# Patient Record
Sex: Female | Born: 1945 | Race: Black or African American | Hispanic: No | Marital: Married | Smoking: Never smoker
Health system: Southern US, Community
[De-identification: ages and names within clinical notes are randomized; demographics above are authoritative.]

## PROBLEM LIST (undated history)

## (undated) DIAGNOSIS — I10 Essential (primary) hypertension: Secondary | ICD-10-CM

## (undated) DIAGNOSIS — E119 Type 2 diabetes mellitus without complications: Secondary | ICD-10-CM

## (undated) DIAGNOSIS — K219 Gastro-esophageal reflux disease without esophagitis: Secondary | ICD-10-CM

## (undated) HISTORY — PX: ABDOMINAL HYSTERECTOMY: SHX81

## (undated) HISTORY — PX: CHOLECYSTECTOMY: SHX55

---

## 2018-04-01 ENCOUNTER — Encounter (HOSPITAL_BASED_OUTPATIENT_CLINIC_OR_DEPARTMENT_OTHER): Payer: Self-pay | Admitting: *Deleted

## 2018-04-01 ENCOUNTER — Other Ambulatory Visit: Payer: Self-pay

## 2018-04-01 ENCOUNTER — Emergency Department (HOSPITAL_BASED_OUTPATIENT_CLINIC_OR_DEPARTMENT_OTHER)
Admission: EM | Admit: 2018-04-01 | Discharge: 2018-04-01 | Disposition: A | Discharge status: Home / Self Care | Payer: Medicare Other | Attending: Emergency Medicine | Admitting: Emergency Medicine

## 2018-04-01 ENCOUNTER — Emergency Department (HOSPITAL_BASED_OUTPATIENT_CLINIC_OR_DEPARTMENT_OTHER): Payer: Medicare Other

## 2018-04-01 DIAGNOSIS — Y998 Other external cause status: Secondary | ICD-10-CM | POA: Insufficient documentation

## 2018-04-01 DIAGNOSIS — Y33XXXA Other specified events, undetermined intent, initial encounter: Secondary | ICD-10-CM | POA: Insufficient documentation

## 2018-04-01 DIAGNOSIS — Y939 Activity, unspecified: Secondary | ICD-10-CM | POA: Diagnosis not present

## 2018-04-01 DIAGNOSIS — S46911A Strain of unspecified muscle, fascia and tendon at shoulder and upper arm level, right arm, initial encounter: Secondary | ICD-10-CM | POA: Diagnosis not present

## 2018-04-01 DIAGNOSIS — I10 Essential (primary) hypertension: Secondary | ICD-10-CM | POA: Insufficient documentation

## 2018-04-01 DIAGNOSIS — E119 Type 2 diabetes mellitus without complications: Secondary | ICD-10-CM | POA: Insufficient documentation

## 2018-04-01 DIAGNOSIS — S4991XA Unspecified injury of right shoulder and upper arm, initial encounter: Secondary | ICD-10-CM | POA: Diagnosis present

## 2018-04-01 DIAGNOSIS — Y929 Unspecified place or not applicable: Secondary | ICD-10-CM | POA: Insufficient documentation

## 2018-04-01 DIAGNOSIS — Z79899 Other long term (current) drug therapy: Secondary | ICD-10-CM | POA: Insufficient documentation

## 2018-04-01 HISTORY — DX: Type 2 diabetes mellitus without complications: E11.9

## 2018-04-01 HISTORY — DX: Gastro-esophageal reflux disease without esophagitis: K21.9

## 2018-04-01 HISTORY — DX: Essential (primary) hypertension: I10

## 2018-04-01 MED ORDER — DICLOFENAC SODIUM 1 % TD GEL: 2.0000 g | Freq: Four times a day (QID) | TRANSDERMAL | 0 refills | Status: AC

## 2018-04-01 NOTE — ED Triage Notes (Signed)
Pain in her right upper arm x 3 months. The pain goes up her neck and into her right ear. states she also has ringing in her ear.

## 2018-04-01 NOTE — ED Provider Notes (Signed)
[facility] EMERGENCY DEPARTMENT Provider Note   CSN: [id number] Arrival date & time: 04/01/18  1513     History   Chief Complaint Chief Complaint  Patient presents with  . Arm Pain    HPI Annette Bond is a 72 y.o. female.  The history is provided by the patient. No language interpreter was used.  Arm Pain    Annette Bond is a 72 y.o. female who presents to the Emergency Department complaining of arm pain. Resents to the emergency department complaining of pain to the right mid humerus for the last three weeks. It is described as throbbing in nature and at times radiates to her right scapular region. It is worse with twisting her body to the right as well as sleeping on her left side. No change with activity. She denies any fevers, chest pain, shortness of breath, change in activity tolerance, nausea, vomiting, abdominal pain, leg swelling or pain. She is taken Tylenol and ibuprofen at home with improvement in her symptoms. She denies any neck pain or back pain. She has a history of hypertension and prediabetes. She recently had a routine physical. No history of blood clots. No history of heart disease. Past Medical History:  Diagnosis Date  . Diabetes mellitus without complication (HCC)   . GERD (gastroesophageal reflux disease)   . Hypertension     There are no active problems to display for this patient.   Past Surgical History:  Procedure Laterality Date  . ABDOMINAL HYSTERECTOMY    . CHOLECYSTECTOMY       OB History   None      Home Medications    Prior to Admission medications   Medication Sig Start Date End Date Taking? Authorizing Provider  Esomeprazole Magnesium (NEXIUM PO) Take by mouth.   Yes [provider]  losartan (COZAAR) 50 MG tablet Take 50 mg by mouth daily.   Yes [provider]  nebivolol (BYSTOLIC) 5 MG tablet Take 5 mg by mouth daily.   Yes [provider]  Rosuvastatin Calcium (CRESTOR PO) Take by  mouth.   Yes [provider]  sitaGLIPtin (JANUVIA) 50 MG tablet Take 50 mg by mouth daily.   Yes [provider]  diclofenac sodium (VOLTAREN) 1 % GEL Apply 2 g topically 4 (four) times daily. 04/01/18   Tilden Fossa, MD    Family History No family history on file.  Social History Social History   Tobacco Use  . Smoking status: Never Smoker  . Smokeless tobacco: Never Used  Substance Use Topics  . Alcohol use: Never    Frequency: Never  . Drug use: Never     Allergies   Percocet [oxycodone-acetaminophen]   Review of Systems Review of Systems  All other systems reviewed and are negative.    Physical Exam Updated Vital Signs BP (!) 152/89 (BP Location: Left Arm)   Pulse 64   Temp 97.7 F (36.5 C) (Oral)   Resp 12   Ht 5\' 3"  (1.6 m)   Wt 87.5 kg   SpO2 100%   BMI 34.19 kg/m   Physical Exam  Constitutional: She is oriented to person, place, and time. She appears well-developed and well-nourished.  HENT:  Head: Normocephalic and atraumatic.  Cardiovascular: Normal rate and regular rhythm.  No murmur heard. Pulmonary/Chest: Effort normal and breath sounds normal. No respiratory distress.  Abdominal: Soft. There is no tenderness. There is no rebound and no guarding.  Musculoskeletal: She exhibits no edema.  Mild tenderness to  palpation over the right mid humerus and proximal upper arm. There is mild tenderness to palpation over the right scapular region. There are no masses or deformities. Range of motion is intact throughout bilateral upper extremities. 2+ radial pulses bilaterally.  Neurological: She is alert and oriented to person, place, and time.  Five out of five grip strength and bilateral upper extremities. Sensation to light touch intact throughout bilateral upper extremities.  Skin: Skin is warm and dry.  Psychiatric: She has a normal mood and affect. Her behavior is normal.  Nursing note and vitals reviewed.    ED Treatments /  Results  Labs (all labs ordered are listed, but only abnormal results are displayed) Labs Reviewed - No data to display  EKG EKG Interpretation  Date/Time:  Tuesday April 01 2018 15:43:39 EDT Ventricular Rate:  68 PR Interval:    QRS Duration: 98 QT Interval:  432 QTC Calculation: 460 R Axis:   36 Text Interpretation:  Sinus rhythm Multiple ventricular premature complexes no prior available for comparison Confirmed by Tilden Fossaees, Maysel Mccolm [phone]) on 04/01/2018 3:50:39 PM Also confirmed by Tilden Fossaees, Sahid Borba [phone]), editor Sheppard EvensSimpson, Miranda ([phone])  on 04/01/2018 3:53:28 PM   Radiology Dg Chest 2 View  Result Date: 04/01/2018 CLINICAL DATA:  Right-sided pain. EXAM: CHEST - 2 VIEW COMPARISON:  None. FINDINGS: The heart size and mediastinal contours are within normal limits. Both lungs are clear. The visualized skeletal structures are unremarkable. IMPRESSION: Clear lungs. Electronically Signed   By: Deatra RobinsonKevin  Herman M.D.   On: 04/01/2018 17:23   Dg Humerus Right  Result Date: 04/01/2018 CLINICAL DATA:  Right humeral head pain radiating to the scapula for the past 3 weeks. No known injury. EXAM: RIGHT HUMERUS - 2+ VIEW COMPARISON:  None. FINDINGS: No acute fracture or dislocation. Mild acromioclavicular osteoarthritis. The glenohumeral joint space is preserved. Soft tissues are unremarkable. IMPRESSION: 1.  No acute osseous abnormality. 2. Mild acromioclavicular osteoarthritis. Electronically Signed   By: Obie DredgeWilliam T Derry M.D.   On: 04/01/2018 17:24    Procedures Procedures (including critical care time)  Medications Ordered in ED Medications - No data to display   Initial Impression / Assessment and Plan / ED Course  I have reviewed the triage vital signs and the nursing notes.  Pertinent labs & imaging results that were available during my care of the patient were reviewed by me and considered in my medical decision making (see chart for details).     Patient here for evaluation of right  arm pain for the last three weeks. Pain is reproducible on palpation and with range of motion of arm and torso. She is neurovascularly intact on examination. Presentation is not consistent with ACS, PE, dissection. Discussed with patient home care for musculoskeletal pain. Discussed outpatient follow-up and return precautions.  Final Clinical Impressions(s) / ED Diagnoses   Final diagnoses:  Strain of right shoulder, initial encounter    ED Discharge Orders         Ordered    diclofenac sodium (VOLTAREN) 1 % GEL  4 times daily     04/01/18 1752           Tilden Fossaees, Kanye Depree, MD 04/01/18 1801

## 2019-06-03 IMAGING — CR DG CHEST 2V
2 series · 2 of 2 positions shown · non-contrast
Comparison: None.

CLINICAL DATA: Right-sided pain.

EXAM:
CHEST - 2 VIEW

[w chest pa]
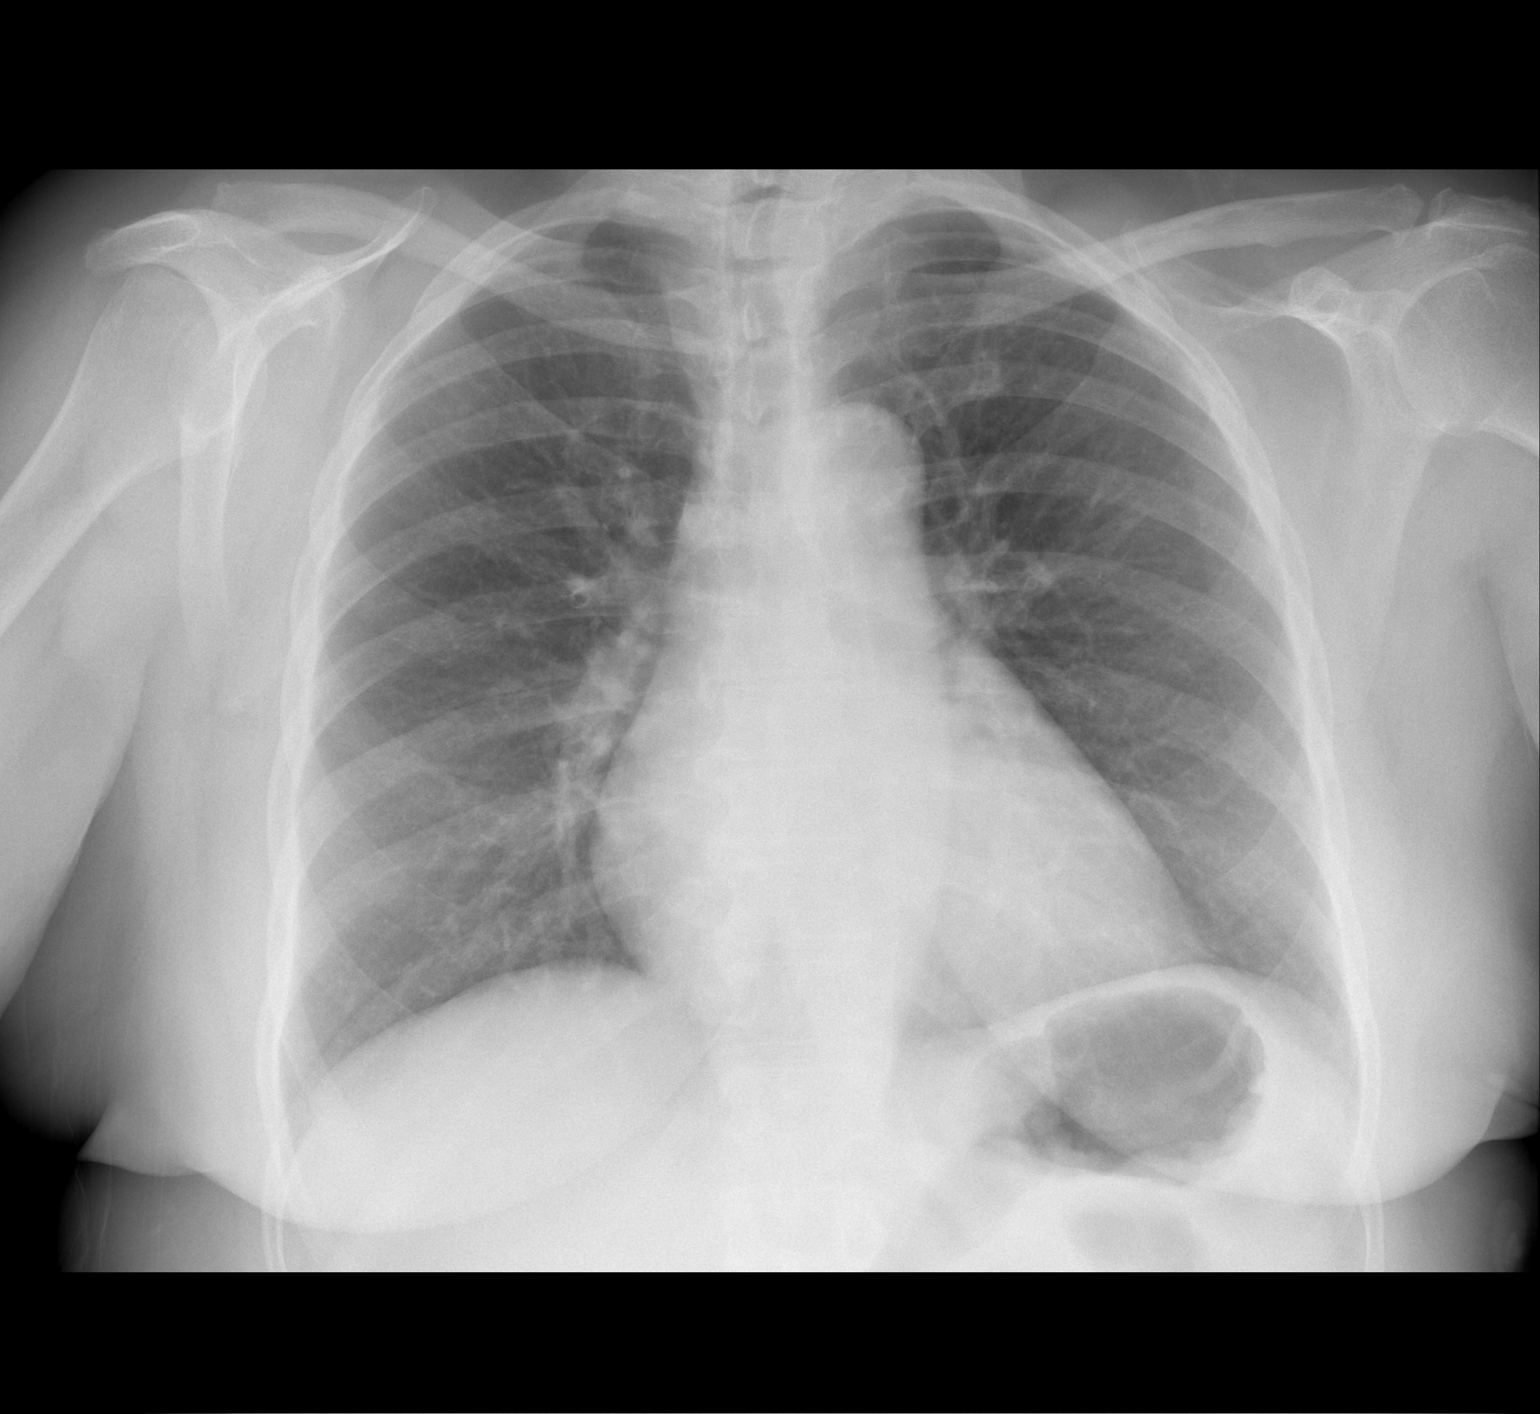

[w chest lat]
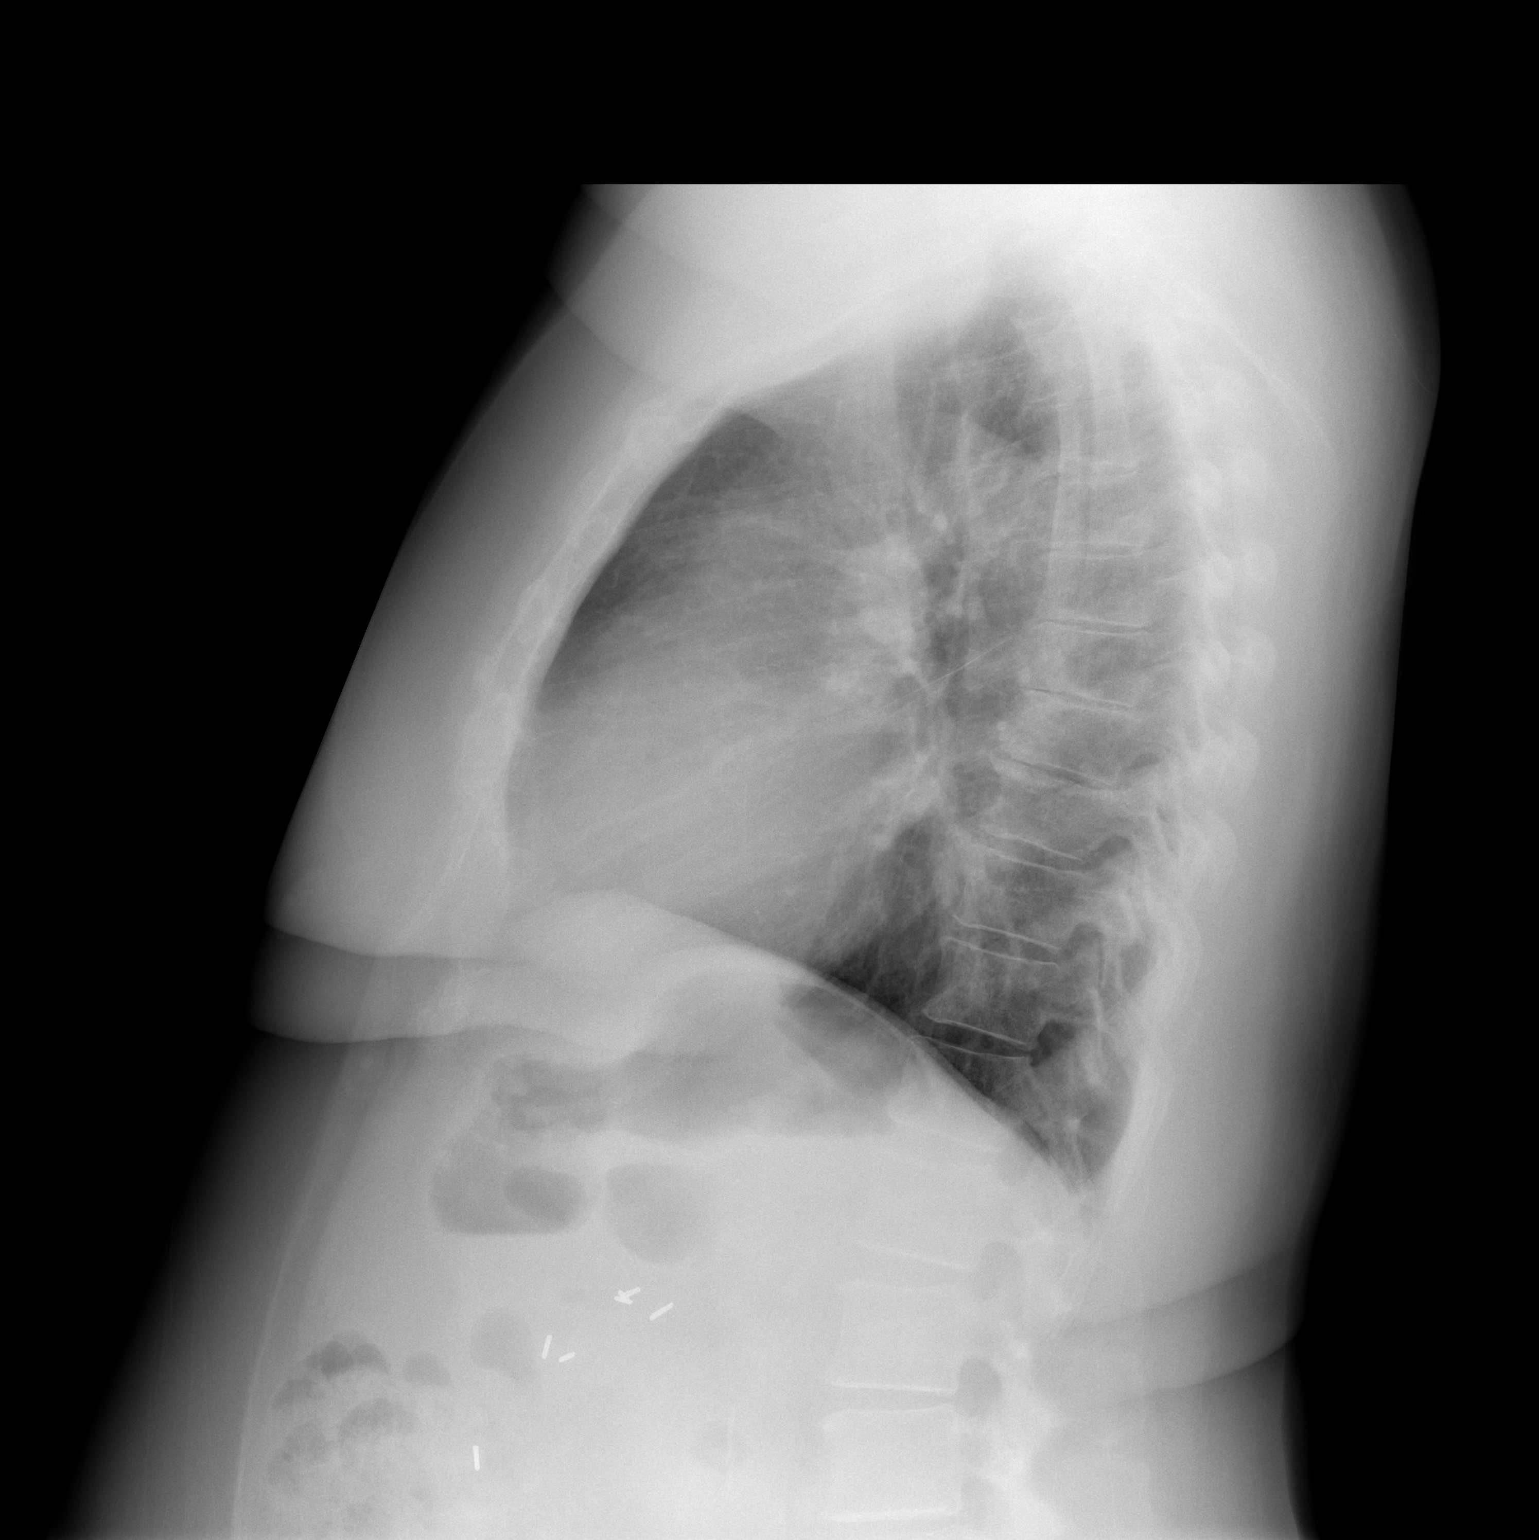

[2 of 2 positions shown; findings below may reference images not displayed]

FINDINGS: The heart size and mediastinal contours are within normal limits.
Both lungs are clear. The visualized skeletal structures are
unremarkable.
IMPRESSION: Clear lungs.
